# Patient Record
Sex: Female | Born: 1985 | Hispanic: Yes | Marital: Single | Smoking: Never smoker
Health system: Southern US, Community
[De-identification: ages and names within clinical notes are randomized; demographics above are authoritative.]

## PROBLEM LIST (undated history)

## (undated) DIAGNOSIS — N946 Dysmenorrhea, unspecified: Secondary | ICD-10-CM

## (undated) HISTORY — DX: Dysmenorrhea, unspecified: N94.6

## (undated) HISTORY — PX: FOOT SURGERY: SHX648

## (undated) HISTORY — PX: TUBAL LIGATION: SHX77

---
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
## 2006-01-18 ENCOUNTER — Emergency Department: Payer: Self-pay | Admitting: Emergency Medicine

## 2006-10-23 IMAGING — CR RIGHT FOOT COMPLETE - 3+ VIEW
1 series · 3 of 3 positions shown · non-contrast
Comparison: none

REASON FOR EXAM: swelling,pain s/p puncture wound r/o foreign body
COMMENTS:

PROCEDURE:     DXR - DXR FOOT RT COMPLETE W/OBLIQUES  - November 16, 2005  [DATE]
RESULT:     Three views of the RIGHT foot show no fracture, dislocation or
other acute bony abnormality.

[Series 1: view not recorded · 0.17mm/px · 3 of 3 slices shown]
[im 1/3]
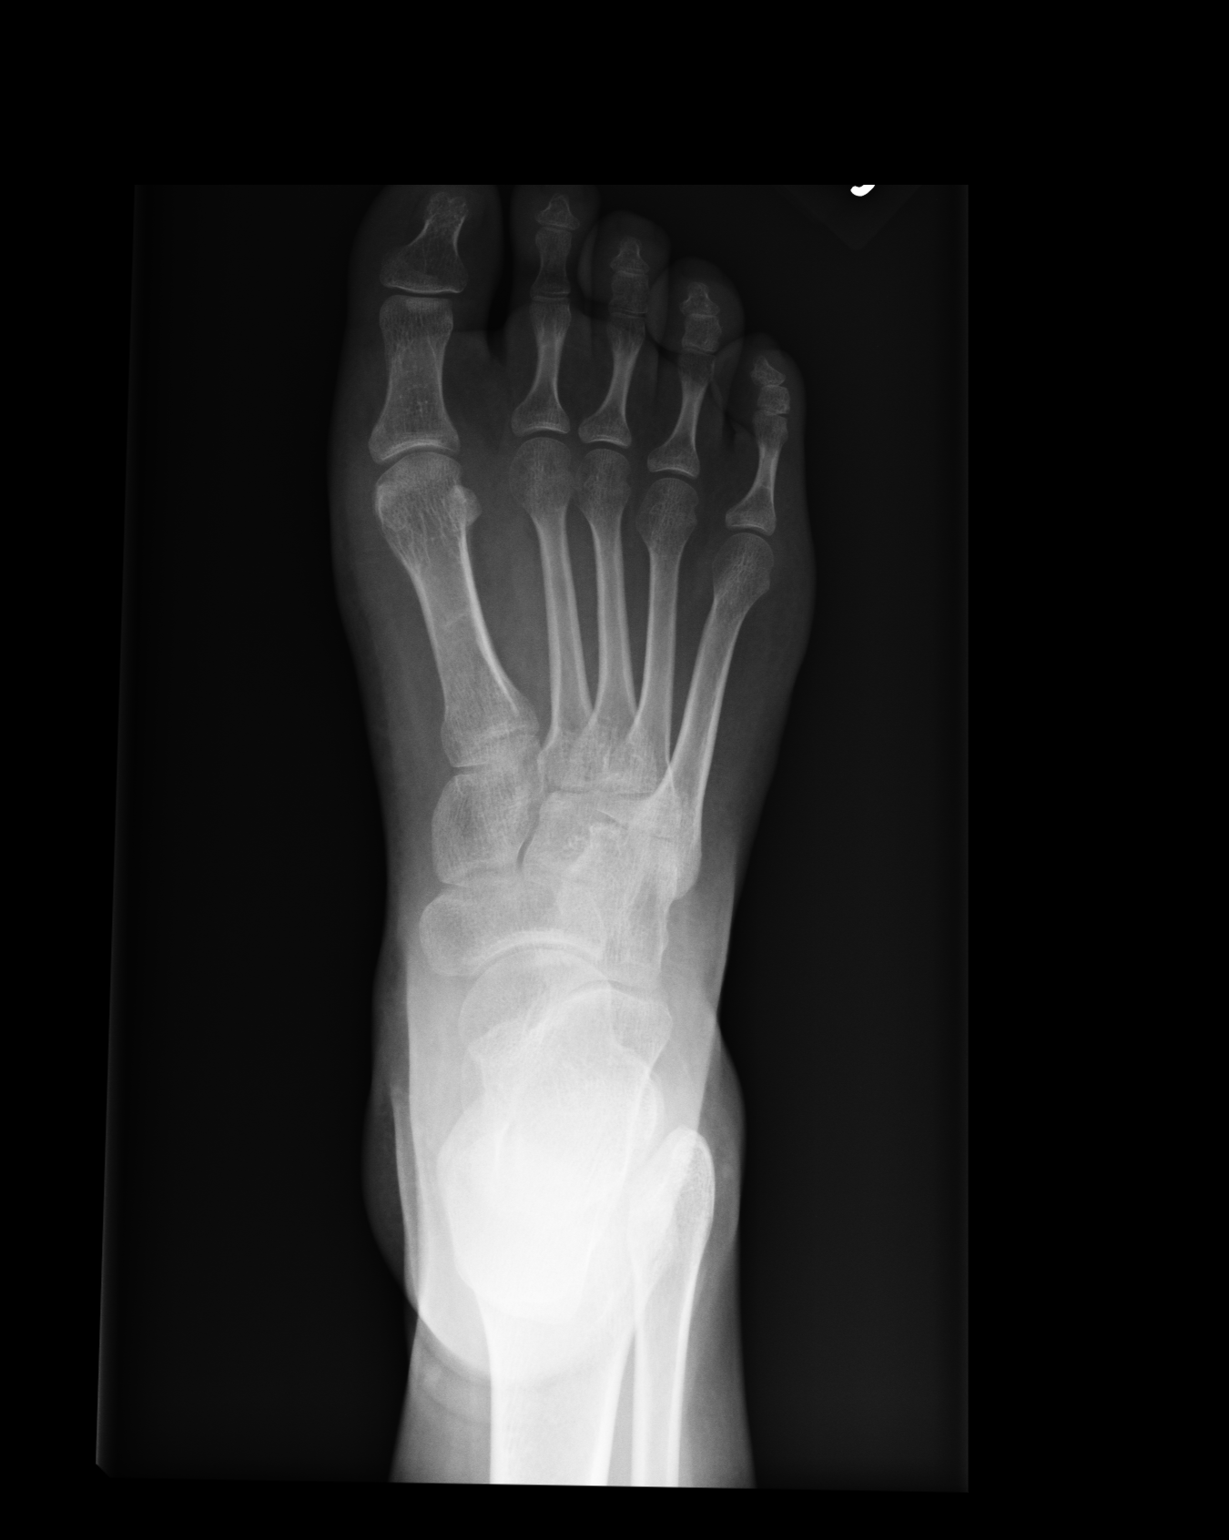
[im 2/3]
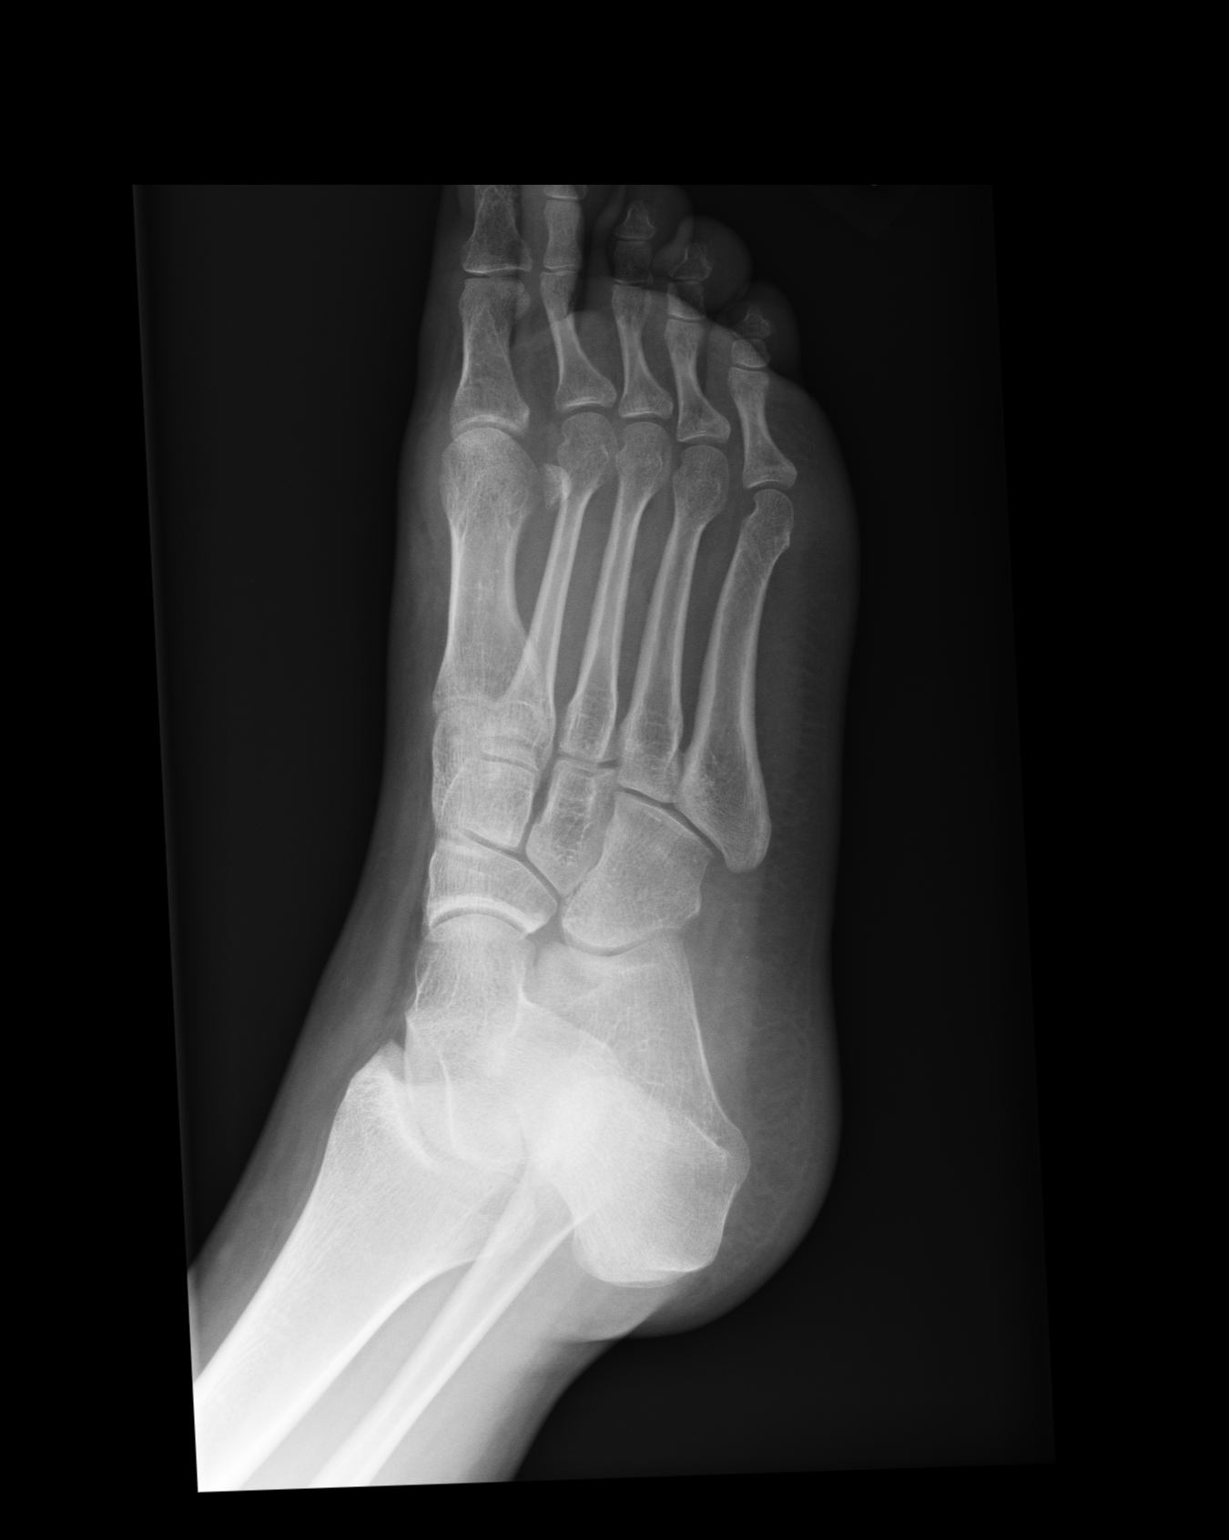
[im 3/3]
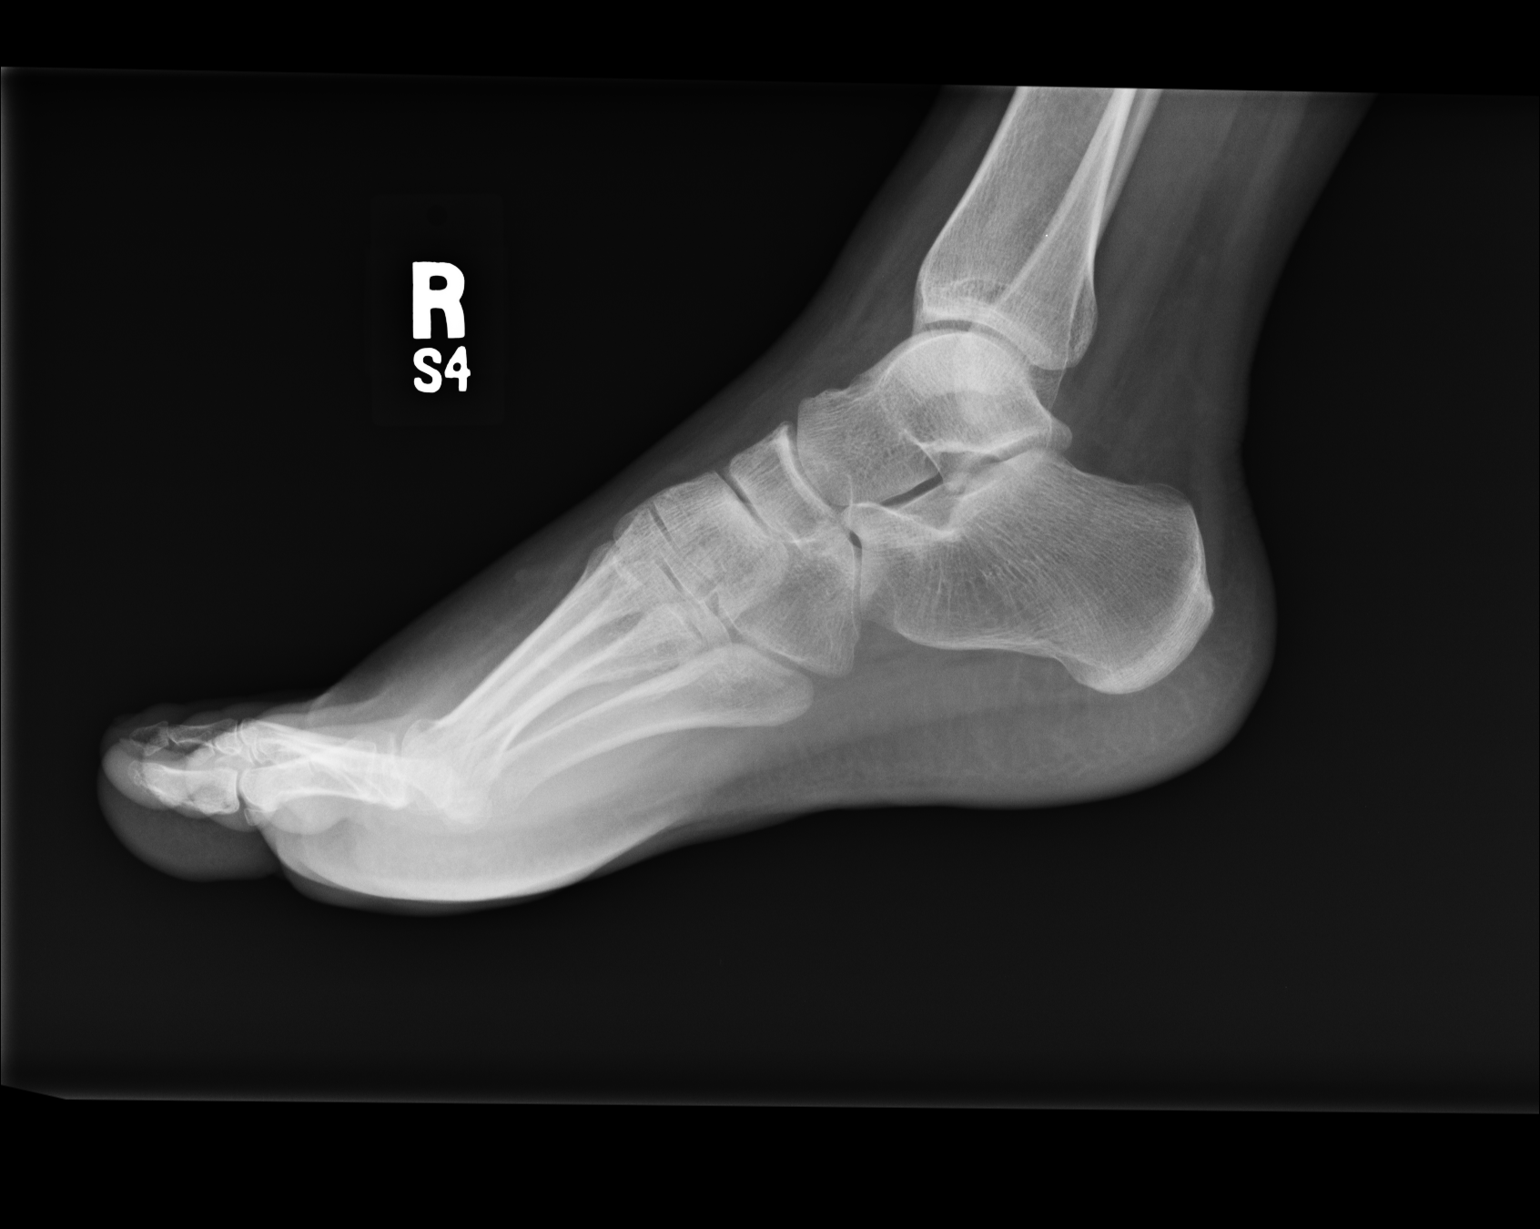

[3 of 3 positions shown; findings below may reference images not displayed]

IMPRESSION: 1)No significant abnormalities are noted.

## 2007-04-20 ENCOUNTER — Emergency Department: Payer: Self-pay | Admitting: Emergency Medicine

## 2008-07-09 ENCOUNTER — Emergency Department: Payer: Self-pay | Admitting: Emergency Medicine

## 2008-08-04 ENCOUNTER — Emergency Department: Payer: Self-pay | Admitting: Emergency Medicine

## 2009-07-05 ENCOUNTER — Ambulatory Visit: Payer: Self-pay | Admitting: Podiatrist

## 2009-10-22 ENCOUNTER — Encounter: Admission: RE | Admit: 2009-10-22 | Discharge: 2009-10-22 | Payer: Self-pay | Admitting: Podiatrist

## 2010-09-19 ENCOUNTER — Ambulatory Visit: Payer: Self-pay | Admitting: Family Medicine

## 2010-11-28 ENCOUNTER — Observation Stay: Payer: Self-pay

## 2011-01-05 ENCOUNTER — Observation Stay: Payer: Self-pay | Admitting: Obstetrics and Gynecology

## 2011-02-25 ENCOUNTER — Inpatient Hospital Stay: Payer: Self-pay | Admitting: Obstetrics and Gynecology

## 2012-01-27 ENCOUNTER — Emergency Department: Payer: Self-pay | Admitting: Emergency Medicine

## 2013-02-09 ENCOUNTER — Observation Stay: Payer: Self-pay | Admitting: Obstetrics and Gynecology

## 2013-02-10 ENCOUNTER — Inpatient Hospital Stay: Payer: Self-pay

## 2013-02-10 LAB — URINALYSIS, COMPLETE
Glucose,UR: NEGATIVE mg/dL (ref 0–75)
Nitrite: NEGATIVE
Ph: 7 (ref 4.5–8.0)
Protein: 30
RBC,UR: 48 /HPF (ref 0–5)
Specific Gravity: 1.02 (ref 1.003–1.030)
WBC UR: 141 /HPF (ref 0–5)

## 2013-02-10 LAB — CBC WITH DIFFERENTIAL/PLATELET
Basophil #: 0 10*3/uL (ref 0.0–0.1)
Basophil %: 0.1 %
Eosinophil #: 0 10*3/uL (ref 0.0–0.7)
Eosinophil %: 0.1 %
HGB: 10.4 g/dL — ABNORMAL LOW (ref 12.0–16.0)
Lymphocyte %: 3.6 %
MCH: 27.5 pg (ref 26.0–34.0)
MCHC: 33.3 g/dL (ref 32.0–36.0)
WBC: 12.6 10*3/uL — ABNORMAL HIGH (ref 3.6–11.0)

## 2013-02-10 LAB — COMPREHENSIVE METABOLIC PANEL
Albumin: 2.5 g/dL — ABNORMAL LOW (ref 3.4–5.0)
Anion Gap: 8 (ref 7–16)
BUN: 5 mg/dL — ABNORMAL LOW (ref 7–18)
Bilirubin,Total: 0.7 mg/dL (ref 0.2–1.0)
Chloride: 107 mmol/L (ref 98–107)
Co2: 21 mmol/L (ref 21–32)
EGFR (African American): >60
EGFR (Non-African Amer.): >60
Potassium: 3.2 mmol/L — ABNORMAL LOW (ref 3.5–5.1)
SGPT (ALT): 14 U/L (ref 12–78)
Total Protein: 6.3 g/dL — ABNORMAL LOW (ref 6.4–8.2)

## 2013-02-12 LAB — URINE CULTURE

## 2013-02-15 LAB — CULTURE, BLOOD (SINGLE)

## 2013-03-29 ENCOUNTER — Inpatient Hospital Stay: Payer: Self-pay | Admitting: Obstetrics and Gynecology

## 2013-03-29 LAB — CBC WITH DIFFERENTIAL/PLATELET
HGB: 10.9 g/dL — ABNORMAL LOW (ref 12.0–16.0)
Lymphocyte #: 2.4 10*3/uL (ref 1.0–3.6)
MCH: 27.4 pg (ref 26.0–34.0)
MCHC: 33.7 g/dL (ref 32.0–36.0)
MCV: 81 fL (ref 80–100)
Monocyte %: 4.9 %
Platelet: 208 10*3/uL (ref 150–440)
RDW: 16 % — ABNORMAL HIGH (ref 11.5–14.5)
WBC: 10.3 10*3/uL (ref 3.6–11.0)

## 2013-10-25 ENCOUNTER — Ambulatory Visit: Payer: Self-pay | Admitting: Obstetrics and Gynecology

## 2013-10-25 LAB — CBC
HCT: 42.2 % (ref 35.0–47.0)
HGB: 14.2 g/dL (ref 12.0–16.0)
MCH: 28.2 pg (ref 26.0–34.0)
MCHC: 33.7 g/dL (ref 32.0–36.0)
MCV: 84 fL (ref 80–100)
Platelet: 252 10*3/uL (ref 150–440)
RBC: 5.05 10*6/uL (ref 3.80–5.20)
RDW: 14.9 % — ABNORMAL HIGH (ref 11.5–14.5)
WBC: 7.1 10*3/uL (ref 3.6–11.0)

## 2013-10-25 LAB — COMPREHENSIVE METABOLIC PANEL
ANION GAP: 2 — AB (ref 7–16)
Albumin: 3.6 g/dL (ref 3.4–5.0)
Alkaline Phosphatase: 87 U/L
BUN: 11 mg/dL (ref 7–18)
Bilirubin,Total: 0.5 mg/dL (ref 0.2–1.0)
Calcium, Total: 8.7 mg/dL (ref 8.5–10.1)
Chloride: 107 mmol/L (ref 98–107)
Co2: 27 mmol/L (ref 21–32)
Creatinine: 0.69 mg/dL (ref 0.60–1.30)
EGFR (Non-African Amer.): >60
Glucose: 108 mg/dL — ABNORMAL HIGH (ref 65–99)
Osmolality: 272 (ref 275–301)
Potassium: 3.8 mmol/L (ref 3.5–5.1)
SGOT(AST): 27 U/L (ref 15–37)
SGPT (ALT): 32 U/L (ref 12–78)
Sodium: 136 mmol/L (ref 136–145)
TOTAL PROTEIN: 7.5 g/dL (ref 6.4–8.2)

## 2013-10-25 LAB — PREGNANCY, URINE: PREGNANCY TEST, URINE: NEGATIVE m[IU]/mL

## 2013-11-02 ENCOUNTER — Ambulatory Visit: Payer: Self-pay | Admitting: Obstetrics and Gynecology

## 2014-09-19 ENCOUNTER — Emergency Department: Payer: Self-pay | Admitting: Emergency Medicine

## 2015-01-12 NOTE — Op Note (Signed)
PATIENT NAME:  Gail Braun, Gail Braun MR#:  [id number] DATE OF BIRTH:  07/07/1986  VISIT NUMBER: [id number]  DATE OF PROCEDURE:  11/02/2013  PREOPERATIVE DIAGNOSIS: Multiparity, desires permanent sterility.  POSTOPERATIVE  DIAGNOSIS: Multiparity, desires permanent sterility.  PROCEDURE: Laparoscopic bilateral tubal ligation using Hulka clips.   ANESTHESIA: General.   SURGEON: Conard Novak, MD  ESTIMATED BLOOD LOSS: Minimal.   OPERATIVE FLUIDS: 900 mL crystalloid.   COMPLICATIONS: None.   FINDINGS: Normal-appearing uterus, fallopian tubes and ovaries.   SPECIMENS: None.   CONDITION: Stable at the end of the procedure.   INDICATION FOR PROCEDURE: The patient is a 29 year old gravida 4, para 4 female who is recently postpartum, who desires permanent sterilization. She has been counseled extensively regarding the risks, benefits and alternatives of permanent sterilization. She has been counseled regarding failure rate of roughly 3 to 5 out of every 1000 procedures performed, with an increased risk of an ectopic pregnancy should pregnancy occur. She has been counseled thoroughly regarding alternatives that have the same efficacy with potentially better side effects and less risk of regret given their reversible nature. Even with all this counseling, she continued to desired permanent sterilization. She was therefore taken to the operating room.   PROCEDURE IN DETAIL: The patient was taken to the operating room, where general anesthesia was administered and found to be adequate. She was placed in dorsal supine lithotomy position in [place] stirrups and prepped and draped in the usual sterile fashion. After a timeout was called, a Foley catheter was placed in the bladder for bladder decompression throughout the procedure. A sponge stick was placed in the vagina for uterine manipulation.   Attention was turned to the abdomen, where a 5 mm infraumbilical incision was made with a scalpel after  injection of local anesthetic. Entry was gained into the abdomen using a trocar with direct visualization of the scope attached to the trocar. Entry was verified using opening pressures. The abdomen was then insufflated with CO2. The camera was reintroduced through the trocar, and verification of atraumatic entry was noted. The pelvic survey was undertaken with the above-noted findings. After local anesthetic was administered, approximately 2 cm in the midline above the pubic symphysis, an 11 mm incision was made, and the 11 mm trocar was placed under direct intra-abdominal camera visualization without difficulty. The right fallopian tube was identified and followed out to the fimbriated end. The Hulka clip applicator was then introduced through the 11 mm port, and at approximately 3 cm from the cornual edge, the fallopian tube was identified, and the clip was placed across the tube such that assurance of complete occlusion of the entire tube was verified. A second clip was applied in the same manner on the right fallopian tube.   The left fallopian tube was similarly identified and followed out to the fimbriated end, and using the same technique, 2 Hulka clips were applied to the left fallopian tube, with assurance that the clip was completely crossing the entire width of the tube.   At this point, the procedure was completed, with hemostasis noted. The abdomen was desufflated of CO2, and the trocars were removed. Each incision site was closed in a subcutaneous fashion using a 4-0 Vicryl, and the skin was reapproximated using Dermabond. Each site was injected with more local anesthetic for a total of 20 mL of 0.5% Marcaine plain.   The patient tolerated the procedure well. Sponge, lap and needle counts were correct x2. The Foley catheter was removed from her bladder  at the end of the procedure, and the sponge stick was also removed from her vagina, and the vagina was inspected to ensure that no remaining  sponges or instrumentation were left in the vagina at the end of the case. For VTE prophylaxis, she was wearing pneumatic compression stockings throughout the entire case. She was awakened in the operating room and taken to the recovery area in stable condition.   ____________________________ Conard NovakStephen D. Khaidyn Staebell, MD sdj:lb D: 11/02/2013 15:06:53 ET T: 11/02/2013 15:30:39 ET JOB#: [id number]  cc: Conard NovakStephen D. Chasyn Cinque, MD, <Dictator> Conard NovakSTEPHEN D Rodderick Holtzer MD ELECTRONICALLY SIGNED 11/19/2013 15:29

## 2015-01-29 NOTE — H&P (Signed)
L&D Evaluation:  History:  HPI 29 yo G4P2103 at 40w5dgestational age by LMP consistent with 20 week ultrasound.  Pregnancy uncomplicated to this point. Presents with fevers, chills, flank pain, trouble breathing, nausea, and emesis since about 1pm on 5/22.  She notes occasional fetal movement.  She was seen several hours ago on L&D for no fetal movement and the patient was febrile, tachycardic to 140s. Fetal heart rate present at 180 with moderate variability. She was sent back to ER where after workup appears she has UTI.  She was sent to ER given respiratory complaints and fever/tachycardia/tachypnea.  She received Rocephin 1 gram at 130am today.  She now notes +fetal movement, no vaginal bleeding, no lof, no contractions. O+ / RI / VZI / HIV neg / RPR NR / GBS unknown   Patient's Medical History No Chronic Illness   Patient's Surgical History cyst removed from foot in 2011   Medications Pre Natal Vitamins   Allergies NKDA   Social History none   Family History Non-Contributory   ROS:  ROS All systems were reviewed.  HEENT, CNS, GI, GU, Respiratory, CV, Renal and Musculoskeletal systems were found to be normal., unless noted in HPI   Exam:  Vital Signs Temp 36.8C (98.55F), P 98, BP 94/55    General no apparent distress   Mental Status clear    Chest clear    Heart normal sinus rhythm   Abdomen gravid, non-tender   Estimated Fetal Weight Average for gestational age   Back CVAT, R>L   Edema no edema    FHT normal rate with no decels   FHT Description 135/mod var/+accels/no decels   Ucx absent   Skin no lesions   Impression:  Impression 1) Intrauterine pregnancy at 395w5destational age, 2) Acute pyelonephritis   Plan:  Comments 1) admit for IV antibiotic administration, Ceftriaxone 1 gram q 24 hours 2) Fetal well being: Reassuring, continue PNVs 3) hypokalemia: given potassium repletion in ER.   Labs:  Lab Results:  Hepatic:  23-May-14 00:10    Bilirubin, Total 0.7  Alkaline Phosphatase 132  SGPT (ALT) 14  SGOT (AST) 22  Total Protein, Serum  6.3  Albumin, Serum  2.5  Routine Chem:  23-May-14 00:10   Glucose, Serum 87  BUN  5  Creatinine (comp) 0.62  Sodium, Serum 136  Potassium, Serum  3.2  Chloride, Serum 107  CO2, Serum 21  Calcium (Total), Serum  8.2  Osmolality (calc) 269  eGFR (African American) >60  eGFR (Non-African American) >60 (eGFR values <6066min/1.73 m2 may be an indication of chronic kidney disease (CKD). Calculated eGFR is useful in patients with stable renal function. The eGFR calculation will not be reliable in acutely ill patients when serum creatinine is changing rapidly. It is not useful in  patients on dialysis. The eGFR calculation may not be applicable to patients at the low and high extremes of body sizes, pregnant women, and vegetarians.)  Anion Gap 8  Routine UA:  23-May-14 00:10   Color (UA) Yellow  Clarity (UA) Hazy  Glucose (UA) Negative  Bilirubin (UA) Negative  Ketones (UA) Trace  Specific Gravity (UA) 1.020  Blood (UA) 1+  pH (UA) 7.0  Protein (UA) 30 mg/dL  Nitrite (UA) Negative  Leukocyte Esterase (UA) 3+ (Result(s) reported on 10 Feb 2013 at 12:48AM.)  RBC (UA) 48 /HPF  WBC (UA) 141 /HPF  Bacteria (UA) 3+  Epithelial Cells (UA) 2 /HPF  Mucous (UA) PRESENT (Result(s) reported on 10 Feb 2013 at 12:48AM.)  Routine Hem:  23-May-14 00:10   WBC (CBC)  12.6  RBC (CBC)  3.79  Hemoglobin (CBC)  10.4  Hematocrit (CBC)  31.3  Platelet Count (CBC) 226  MCV 83  MCH 27.5  MCHC 33.3  RDW 14.2  Neutrophil % 95.6  Lymphocyte % 3.6  Monocyte % 0.6  Eosinophil % 0.1  Basophil % 0.1  Neutrophil #  12.1  Lymphocyte #  0.5  Monocyte #  0.1  Eosinophil # 0.0  Basophil # 0.0 (Result(s) reported on 10 Feb 2013 at 12:50AM.)   Electronic Signatures: Will Bonnet (MD)  (Signed 23-May-14 02:11)  Authored: L&D Evaluation, Labs   Last Updated: 23-May-14 02:11 by Will Bonnet (MD)

## 2015-01-29 NOTE — H&P (Signed)
L&D Evaluation:  History:  HPI 29 year old G4 P2103 with EDC=04/02/2013 by a 20 wk 3 day ultrasound presents for an elective IOL at 2239 3/7 weeks as her husband will be going out of town for a job in 4 days. She has been having irregular contractions and her cx is 4 cm dilated. [facility] for late onset to care, a hospitalization for pyelo at 32 weeks for which continues on Macrobid daily for prophyllaxis, and a normal anatomy scan. OB HX significant for a PTD at 35 weeks in 2003, an IOL in 2006 for oligo at 41 weeks and an IOL at 42 weeks in 2012 for postdates. Infant weights ranged from 4#7oz to 8#12oz. LABS: O pos, RI, VI, GBS negative. PAtient desires ppBTL and has has been counseled on pros, cons and risks. 30 day papers signed 01/11/2013   Presents with for elective IOL   Patient's Medical History Pyelo   Patient's Surgical History foot surgery   Medications Pre Natal Vitamins  MacroBID daily   Allergies NKDA   Social History none   Family History Non-Contributory   ROS:  ROS see HPI   Exam:  Vital Signs stable   Urine Protein not completed   General no apparent distress   Mental Status clear   Chest clear   Heart normal sinus rhythm, no murmur/gallop/rubs   Abdomen gravid, non-tender   Estimated Fetal Weight Average for gestational age   Fetal Position vtx   Edema no edema   Pelvic no external lesions, 4/40%/-2   Mebranes Intact   FHT normal rate with no decels   FHT Description mod variability   Ucx occasional   Skin dry   Impression:  Impression IUP at 39 3/7 weeks for IOL   Plan:  Plan EFM/NST, monitor contractions and for cervical change, Plan Pitocin IOL-explained risks of hyperstimulation, FITL, C-section.. Patient  desires to proceed with IOL.   Electronic Signatures: Trinna BalloonGutierrez, Dama Hedgepeth L (CNM)  (Signed 09-Jul-14 11:17)  Authored: L&D Evaluation   Last Updated: 09-Jul-14 11:17 by Trinna BalloonGutierrez, Joeph Szatkowski L (CNM)

## 2015-02-09 ENCOUNTER — Emergency Department
Admission: EM | Admit: 2015-02-09 | Discharge: 2015-02-09 | Disposition: A | Discharge status: Home / Self Care | Payer: BLUE CROSS/BLUE SHIELD | Attending: Emergency Medicine | Admitting: Emergency Medicine

## 2015-02-09 ENCOUNTER — Encounter: Payer: Self-pay | Admitting: *Deleted

## 2015-02-09 DIAGNOSIS — R51 Headache: Secondary | ICD-10-CM | POA: Diagnosis not present

## 2015-02-09 DIAGNOSIS — K029 Dental caries, unspecified: Secondary | ICD-10-CM | POA: Diagnosis not present

## 2015-02-09 DIAGNOSIS — K047 Periapical abscess without sinus: Secondary | ICD-10-CM | POA: Diagnosis not present

## 2015-02-09 DIAGNOSIS — K088 Other specified disorders of teeth and supporting structures: Secondary | ICD-10-CM | POA: Diagnosis present

## 2015-02-09 MED ORDER — TRAMADOL HCL 50 MG PO TABS
ORAL_TABLET | ORAL | Status: DC
Start: 2015-02-09 — End: 2015-02-09
  Filled 2015-02-09: qty 1

## 2015-02-09 MED ORDER — TRAMADOL HCL 50 MG PO TABS
50.0000 mg | ORAL_TABLET | Freq: Once | ORAL | Status: AC
  Administered 2015-02-09: 50 mg via ORAL

## 2015-02-09 MED ORDER — TRAMADOL HCL 50 MG PO TABS: 50.0000 mg | ORAL_TABLET | Freq: Four times a day (QID) | ORAL | Status: AC | PRN

## 2015-02-09 MED ORDER — AMOXICILLIN 500 MG PO CAPS: 500.0000 mg | ORAL_CAPSULE | Freq: Three times a day (TID) | ORAL | Status: AC

## 2015-02-09 NOTE — ED Provider Notes (Signed)
CSN: [id number]     Arrival date & time 02/09/15  4098 History   First MD Initiated Contact with Patient 02/09/15 [phone]     Chief Complaint  Patient presents with  . Dental Pain     (Consider location/radiation/quality/duration/timing/severity/associated sxs/prior Treatment) HPI Comments: Pt c/o right lower dental pain since yesterday to 1st molar. Pt has known cavity to tooth. Taking motrin without relief. Sees a dentist, Dr. [place], but was unable to get in touch with him yesterday.   Patient is a 29 y.o. female presenting with tooth pain. The history is provided by the patient.  Dental Pain Location:  Lower Lower teeth location:  30/RL 1st molar Quality:  Dull Severity:  Severe Onset quality:  Sudden Duration:  2 days Timing:  Constant Progression:  Unchanged Chronicity:  New Context: dental caries and poor dentition   Prior workup: none. Relieved by:  Nothing Worsened by:  Hot food/drink, jaw movement, pressure, touching and cold food/drink Ineffective treatments:  NSAIDs Associated symptoms: facial pain and gum swelling   Associated symptoms: no facial swelling, no fever, no headaches, no oral bleeding, no oral lesions and no trismus   Risk factors: sufficient dental care     History reviewed. No pertinent past medical history. Past Surgical History  Procedure Laterality Date  . Foot surgery Right    History reviewed. No pertinent family history. History  Substance Use Topics  . Smoking status: Never Smoker   . Smokeless tobacco: Never Used  . Alcohol Use: No   OB History    No data available     Review of Systems  Constitutional: Negative for fever.  HENT: Positive for dental problem. Negative for facial swelling and mouth sores.   Neurological: Negative for headaches.  All other systems reviewed and are negative.     Allergies  Review of patient's allergies indicates no known allergies.  Home Medications   Prior to Admission medications     Medication Sig Start Date End Date Taking? Authorizing Provider  amoxicillin (AMOXIL) 500 MG capsule Take 1 capsule (500 mg total) by mouth 3 (three) times daily. 02/09/15 02/19/15  Luvenia Redden, PA-C  traMADol (ULTRAM) 50 MG tablet Take 1 tablet (50 mg total) by mouth every 6 (six) hours as needed. 02/09/15 02/09/16  Wilber Oliphant V, PA-C   BP 128/81 mmHg  Pulse 64  Temp(Src) 98.2 F (36.8 C) (Oral)  Resp 18  Ht  (1.575 m)  Wt 198 lb (89.812 kg)  BMI 36.21 kg/m2  SpO2 99%  LMP 02/09/2015 (Exact Date) Physical Exam  Constitutional: She appears well-developed and well-nourished.  HENT:  Head: Normocephalic and atraumatic.  Right Ear: Tympanic membrane and external ear normal.  Left Ear: Tympanic membrane and external ear normal.  Nose: Nose normal.  Mouth/Throat: Uvula is midline, oropharynx is clear and moist and mucous membranes are normal. Abnormal dentition. Dental abscesses and dental caries present.    Eyes: Conjunctivae are normal.  Neck: Trachea normal, normal range of motion and phonation normal. Neck supple.  Psychiatric: She has a normal mood and affect. Her speech is normal.  Nursing note and vitals reviewed.   ED Course  Procedures (including critical care time) Labs Review Labs Reviewed - No data to display  Imaging Review No results found.   EKG Interpretation None      MDM  Course of Amoxicillin for infection Ultram as needed for pain,  PO given in ER Advised to follow up with Dentist on Monday, return for  worsening symptoms.  Final diagnoses:  Dental abscess       Luvenia Reddenmma Weavil V, PA-C 02/09/15 [id number]  Governor Rooksebecca Lord, MD 02/11/15 1515

## 2015-02-09 NOTE — Discharge Instructions (Signed)

## 2015-02-09 NOTE — ED Notes (Addendum)
Pt presents w/ c/o R lower dental pain, worsening yesterday. Pt has 3 mon hx of having dental problems on that side. Ibuprofen 800 mg last taken x 1 hr ago w/o relief. Pt is going to obtain a ride home.

## 2015-11-14 ENCOUNTER — Emergency Department
Admission: EM | Admit: 2015-11-14 | Discharge: 2015-11-14 | Disposition: A | Discharge status: Home / Self Care | Payer: 59 | Attending: Emergency Medicine | Admitting: Emergency Medicine

## 2015-11-14 ENCOUNTER — Encounter: Payer: Self-pay | Admitting: Emergency Medicine

## 2015-11-14 DIAGNOSIS — Z3202 Encounter for pregnancy test, result negative: Secondary | ICD-10-CM | POA: Insufficient documentation

## 2015-11-14 DIAGNOSIS — N3000 Acute cystitis without hematuria: Secondary | ICD-10-CM

## 2015-11-14 DIAGNOSIS — J020 Streptococcal pharyngitis: Secondary | ICD-10-CM | POA: Diagnosis not present

## 2015-11-14 DIAGNOSIS — R509 Fever, unspecified: Secondary | ICD-10-CM | POA: Diagnosis present

## 2015-11-14 LAB — POCT PREGNANCY, URINE: Preg Test, Ur: NEGATIVE

## 2015-11-14 LAB — URINALYSIS COMPLETE WITH MICROSCOPIC (ARMC ONLY)
BILIRUBIN URINE: NEGATIVE
GLUCOSE, UA: NEGATIVE mg/dL
Hgb urine dipstick: NEGATIVE
KETONES UR: NEGATIVE mg/dL
Nitrite: NEGATIVE
Protein, ur: NEGATIVE mg/dL
Specific Gravity, Urine: 1.018 (ref 1.005–1.030)
pH: 8 (ref 5.0–8.0)

## 2015-11-14 LAB — POCT RAPID STREP A: STREPTOCOCCUS, GROUP A SCREEN (DIRECT): POSITIVE — AB

## 2015-11-14 LAB — RAPID INFLUENZA A&B ANTIGENS (ARMC ONLY)
INFLUENZA A (ARMC): NOT DETECTED
INFLUENZA B (ARMC): NOT DETECTED

## 2015-11-14 MED ORDER — LIDOCAINE VISCOUS 2 % MT SOLN: 20.0000 mL | OROMUCOSAL | Status: DC | PRN

## 2015-11-14 MED ORDER — CEPHALEXIN 500 MG PO CAPS: 500.0000 mg | ORAL_CAPSULE | Freq: Four times a day (QID) | ORAL | Status: AC

## 2015-11-14 NOTE — Discharge Instructions (Signed)

## 2015-11-14 NOTE — ED Provider Notes (Signed)
[facility] Emergency Department Provider Note  ____________________________________________  Time seen: Approximately 4:16 PM  I have reviewed the triage vital signs and the nursing notes.   HISTORY  Chief Complaint Generalized Body Aches and Fever    HPI Gail Braun is a 30 y.o. female resents with complaints of fever, chills, body aches and sore throat 2 days. In addition patient states that she is noted urinary burning 1 day. Onset of both was sudden. Described as moderate symptoms around 5 or 6/10. Denies any vaginal discharge. No recent surgeries new meds no change in activity. Nothing seems to make the symptoms better or worse. Has tried over-the-counter medications without relief. She reports having 4 children at home all diagnosed with strep throat.  History reviewed. No pertinent past medical history.  There are no active problems to display for this patient.   Past Surgical History  Procedure Laterality Date  . Foot surgery Right   . Tubal ligation      Current Outpatient Rx  Name  Route  Sig  Dispense  Refill  . cephALEXin (KEFLEX) 500 MG capsule   Oral   Take 1 capsule (500 mg total) by mouth 4 (four) times daily.   40 capsule   0   . lidocaine (XYLOCAINE) 2 % solution   Mouth/Throat   Use as directed 20 mLs in the mouth or throat as needed for mouth pain.   100 mL   0     Allergies Review of patient's allergies indicates no known allergies.  No family history on file.  Social History Social History  Substance Use Topics  . Smoking status: Never Smoker   . Smokeless tobacco: None  . Alcohol Use: No    Review of Systems Constitutional: Positive for fever/chills Eyes: No visual changes. ENT: Positive sore throat. Cardiovascular: Denies chest pain. Respiratory: Denies shortness of breath. Gastrointestinal: No abdominal pain.  No nausea, no vomiting.  No diarrhea.  No constipation. Genitourinary: Positive for  dysuria Musculoskeletal: Negative for back pain. Skin: Negative for rash. Neurological: Negative for headaches, focal weakness or numbness.  10-point ROS otherwise negative.  ____________________________________________   PHYSICAL EXAM:  VITAL SIGNS: ED Triage Vitals  Enc Vitals Group     BP 11/14/15 1555 120/68 mmHg     Pulse Rate 11/14/15 1555 69     Resp 11/14/15 1555 16     Temp 11/14/15 1555 98.3 F (36.8 C)     Temp Source 11/14/15 1555 Oral     SpO2 11/14/15 1555 99 %     Weight 11/14/15 1555 184 lb (83.462 kg)     Height 11/14/15 1555  (1.575 m)     Head Cir --      Peak Flow --      Pain Score 11/14/15 1555 7     Pain Loc --      Pain Edu? --      Excl. in GC? --     Constitutional: Alert and oriented. Well appearing and in no acute distress. Eyes: Conjunctivae are normal. PERRL. EOMI. Head: Atraumatic. Nose: No congestion/rhinnorhea. Mouth/Throat: Mucous membranes are moist.  Oropharynx mildly erythematous Neck: No stridor.  No adenopathy full range of motion. Cardiovascular: Normal rate, regular rhythm. Grossly normal heart sounds.  Good peripheral circulation. Respiratory: Normal respiratory effort.  No retractions. Lungs CTAB. Gastrointestinal: Soft and nontender. No distention. No CVA tenderness. Musculoskeletal: No lower extremity tenderness nor edema.  No joint effusions. Neurologic:  Normal speech and language. No  gross focal neurologic deficits are appreciated. No gait instability. Skin:  Skin is warm, dry and intact. No rash noted. Psychiatric: Mood and affect are normal. Speech and behavior are normal.  ____________________________________________   LABS (all labs ordered are listed, but only abnormal results are displayed)  Labs Reviewed  URINALYSIS COMPLETEWITH MICROSCOPIC ([facility] ONLY) - Abnormal; Notable for the following:    Color, Urine YELLOW (*)    APPearance TURBID (*)    Leukocytes, UA TRACE (*)    Bacteria, UA RARE (*)     Squamous Epithelial / LPF 0-5 (*)    All other components within normal limits  POCT RAPID STREP A - Abnormal; Notable for the following:    Streptococcus, Group A Screen (Direct) POSITIVE (*)    All other components within normal limits  RAPID INFLUENZA A&B ANTIGENS ([facility] ONLY)  POC URINE PREG, ED  POCT PREGNANCY, URINE   ____________________________________________     PROCEDURES  Procedure(s) performed: None  Critical Care performed: No  ____________________________________________   INITIAL IMPRESSION / ASSESSMENT AND PLAN / ED COURSE  Pertinent labs & imaging results that were available during my care of the patient were reviewed by me and considered in my medical decision making (see chart for details).  Rapid strep positive. Diagnosis is acute strep pharyngitis. Urinary tract infection. Rx given for Keflex 500 mg 4 times a day 10 days. Patient given a work excuse 48 hours and her follow-up with her PCP or return to the ER with any worsening symptomology. Patient voices no other emergency medical complaints at this time. ____________________________________________   FINAL CLINICAL IMPRESSION(S) / ED DIAGNOSES  Final diagnoses:  Streptococcal pharyngitis  Acute cystitis without hematuria     This chart was dictated using voice recognition software/Dragon. Despite best efforts to proofread, errors can occur which can change the meaning. Any change was purely unintentional.   Evangeline Dakin, PA-C 11/14/15 1802  Phineas Semen, MD 11/14/15 1950

## 2015-11-14 NOTE — ED Notes (Signed)
Pt reports sore throat, fever and generalized body aches since Tuesday. Denies any other symptoms at present. Pt alert and oriented upon triage, no apparent distress noted.

## 2015-11-14 NOTE — ED Notes (Signed)
Fever body aches and sore throat since Tuesday . Afebrile at present   No diff swallowing noted

## 2015-11-15 ENCOUNTER — Encounter: Payer: Self-pay | Admitting: Emergency Medicine

## 2016-07-20 ENCOUNTER — Emergency Department: Payer: BLUE CROSS/BLUE SHIELD

## 2016-07-20 ENCOUNTER — Encounter: Payer: Self-pay | Admitting: Emergency Medicine

## 2016-07-20 ENCOUNTER — Emergency Department
Admission: EM | Admit: 2016-07-20 | Discharge: 2016-07-20 | Disposition: A | Discharge status: Home / Self Care | Payer: BLUE CROSS/BLUE SHIELD | Attending: Emergency Medicine | Admitting: Emergency Medicine

## 2016-07-20 DIAGNOSIS — M70832 Other soft tissue disorders related to use, overuse and pressure, left forearm: Secondary | ICD-10-CM

## 2016-07-20 DIAGNOSIS — M25532 Pain in left wrist: Secondary | ICD-10-CM

## 2016-07-20 DIAGNOSIS — Y9389 Activity, other specified: Secondary | ICD-10-CM | POA: Insufficient documentation

## 2016-07-20 DIAGNOSIS — M25832 Other specified joint disorders, left wrist: Secondary | ICD-10-CM

## 2016-07-20 DIAGNOSIS — M70932 Unspecified soft tissue disorder related to use, overuse and pressure, left forearm: Secondary | ICD-10-CM | POA: Diagnosis not present

## 2016-07-20 MED ORDER — NAPROXEN 500 MG PO TABS: 500.0000 mg | ORAL_TABLET | Freq: Two times a day (BID) | ORAL | 0 refills | Status: DC

## 2016-07-20 MED ORDER — NAPROXEN 500 MG PO TABS
500.0000 mg | ORAL_TABLET | Freq: Once | ORAL | Status: AC
  Administered 2016-07-20: 500 mg via ORAL
  Filled 2016-07-20: qty 1

## 2016-07-20 NOTE — ED Provider Notes (Signed)
[facility] Emergency Department Provider Note   ____________________________________________   First MD Initiated Contact with Patient 07/20/16 1519     (approximate)  I have reviewed the triage vital signs and the nursing notes.   HISTORY  Chief Complaint Wrist Pain    HPI Gail Braun is a 30 y.o. female patient complaining of left wrist pain for 2 weeks. Paced the pain is increased in the last 2 days. Patient stated wrist is more sore in the morning and gets better with use but the pain never completely resolves. Patient rates the pain as 8/10. She described a pain as "achy". Except for over-the-counter anti-inflammatory medication and no other ecchymosis measures for this complaint. Patient is right-hand dominant.   History reviewed. No pertinent past medical history.  There are no active problems to display for this patient.   Past Surgical History:  Procedure Laterality Date  . FOOT SURGERY Right   . TUBAL LIGATION      Prior to Admission medications   Medication Sig Start Date End Date Taking? Authorizing Provider  lidocaine (XYLOCAINE) 2 % solution Use as directed 20 mLs in the mouth or throat as needed for mouth pain. 11/14/15   Evangeline Dakinharles M Beers, PA-C  naproxen (NAPROSYN) 500 MG tablet Take 1 tablet (500 mg total) by mouth 2 (two) times daily with a meal. 07/20/16   Joni Reiningonald K Smith, PA-C    Allergies Review of patient's allergies indicates no known allergies.  No family history on file.  Social History Social History  Substance Use Topics  . Smoking status: Never Smoker  . Smokeless tobacco: Never Used  . Alcohol use No    Review of Systems Constitutional: No fever/chills Eyes: No visual changes. ENT: No sore throat. Cardiovascular: Denies chest pain. Respiratory: Denies shortness of breath. Gastrointestinal: No abdominal pain.  No nausea, no vomiting.  No diarrhea.  No constipation. Genitourinary: Negative for  dysuria. Musculoskeletal: Left wrist pain  Skin: Negative for rash. Neurological: Negative for headaches, focal weakness or numbness.    ____________________________________________   PHYSICAL EXAM:  VITAL SIGNS: ED Triage Vitals  Enc Vitals Group     BP 07/20/16 1508 125/67     Pulse Rate 07/20/16 1508 64     Resp 07/20/16 1508 18     Temp 07/20/16 1508 97.8 F (36.6 C)     Temp Source 07/20/16 1508 Oral     SpO2 07/20/16 1508 99 %     Weight 07/20/16 1508 188 lb (85.3 kg)     Height 07/20/16 1508 5\' 2"  (1.575 m)     Head Circumference --      Peak Flow --      Pain Score 07/20/16 1512 8     Pain Loc --      Pain Edu? --      Excl. in GC? --     Constitutional: Alert and oriented. Well appearing and in no acute distress. Eyes: Conjunctivae are normal. PERRL. EOMI. Head: Atraumatic. Nose: No congestion/rhinnorhea. Mouth/Throat: Mucous membranes are moist.  Oropharynx non-erythematous. Neck: No stridor.  No cervical spine tenderness to palpation. Hematological/Lymphatic/Immunilogical: No cervical lymphadenopathy. Cardiovascular: Normal rate, regular rhythm. Grossly normal heart sounds.  Good peripheral circulation. Respiratory: Normal respiratory effort.  No retractions. Lungs CTAB. Gastrointestinal: Soft and nontender. No distention. No abdominal bruits. No CVA tenderness. Musculoskeletal: No obvious deformity to the left wrist. No obvious edema or erythema. Patient has full nuchal range of motion's. Her grip strength is 4/5. Patient  has negative Tinel or Phalen sign.  Neurologic:  Normal speech and language. No gross focal neurologic deficits are appreciated. No gait instability. Skin:  Skin is warm, dry and intact. No rash noted. Psychiatric: Mood and affect are normal. Speech and behavior are normal.  ____________________________________________   LABS (all labs ordered are listed, but only abnormal results are displayed)  Labs Reviewed - No data to  display ____________________________________________  EKG   ____________________________________________  RADIOLOGY  No acute findings x-ray of the left wrist. ____________________________________________   PROCEDURES  Procedure(s) performed: None  Procedures  Critical Care performed: No  ____________________________________________   INITIAL IMPRESSION / ASSESSMENT AND PLAN / ED COURSE  Pertinent labs & imaging results that were available during my care of the patient were reviewed by me and considered in my medical decision making (see chart for details).  Wrist pain secondary to repetitive motion. Patient given discharge Instructions. Patient given a prescription for naproxen. Patient given a work note.  Clinical Course   Patient placed in a Velcro wrist splint. Patient advised to try to use this at work and while she is awake. Patient advised not to sleep with splint. ____________________________________________   FINAL CLINICAL IMPRESSION(S) / ED DIAGNOSES  Final diagnoses:  Left wrist pain  Repetitive motion disease of wrist, left      NEW MEDICATIONS STARTED DURING THIS VISIT:  New Prescriptions   NAPROXEN (NAPROSYN) 500 MG TABLET    Take 1 tablet (500 mg total) by mouth 2 (two) times daily with a meal.     Note:  This document was prepared using Dragon voice recognition software and may include unintentional dictation errors.    Joni Reiningonald K Smith, PA-C 07/20/16 1613    Emily FilbertJonathan E Williams, MD 07/21/16 [phone]

## 2016-07-20 NOTE — Discharge Instructions (Signed)
Wear splint for 5-7 days while awake and at work.

## 2016-07-20 NOTE — ED Triage Notes (Signed)
Presents with left wrist pain for about 2 weeks   Denies any injury   No swelling  States wrist is more sore in the am ..

## 2018-04-30 ENCOUNTER — Encounter: Payer: Self-pay | Admitting: Emergency Medicine

## 2018-04-30 ENCOUNTER — Emergency Department
Admission: EM | Admit: 2018-04-30 | Discharge: 2018-04-30 | Disposition: A | Discharge status: Home / Self Care | Payer: BLUE CROSS/BLUE SHIELD | Attending: Emergency Medicine | Admitting: Emergency Medicine

## 2018-04-30 ENCOUNTER — Other Ambulatory Visit: Payer: Self-pay

## 2018-04-30 DIAGNOSIS — R21 Rash and other nonspecific skin eruption: Secondary | ICD-10-CM | POA: Diagnosis present

## 2018-04-30 DIAGNOSIS — L089 Local infection of the skin and subcutaneous tissue, unspecified: Secondary | ICD-10-CM

## 2018-04-30 MED ORDER — CLINDAMYCIN HCL 300 MG PO CAPS: 300.0000 mg | ORAL_CAPSULE | Freq: Three times a day (TID) | ORAL | 0 refills | Status: AC

## 2018-04-30 NOTE — ED Notes (Signed)
See triage note  States she noticed a "pimple" above lip yesterday  Now area is more painful and swollen

## 2018-04-30 NOTE — Discharge Instructions (Signed)
Please use warm compresses to upper lip. Begin antibiotics. Return to the emergency department for fevers, worsening swelling or pain, or any other concerning symptoms.

## 2018-04-30 NOTE — ED Triage Notes (Signed)
Noted small red sore upper lip. States noticed yesterday.

## 2018-04-30 NOTE — ED Provider Notes (Signed)
[facility] Emergency Department Provider Note  ____________________________________________  Time seen: Approximately 11:05 AM  I have reviewed the triage vital signs and the nursing notes.   HISTORY  Chief Complaint Facial Pain    HPI Gail Braun is a 32 y.o. female that presents emergency department for evaluation of red sore above lip since yesterday.  Patient states that she had a pimple above her lip yesterday.  Today it is more swollen, red, painful.  No drainage.  No fever, chills.  History reviewed. No pertinent past medical history.  There are no active problems to display for this patient.   Past Surgical History:  Procedure Laterality Date  . FOOT SURGERY Right   . TUBAL LIGATION      Prior to Admission medications   Medication Sig Start Date End Date Taking? Authorizing Provider  clindamycin (CLEOCIN) 300 MG capsule Take 1 capsule (300 mg total) by mouth 3 (three) times daily for 10 days. 04/30/18 05/10/18  Enid Derry, PA-C  lidocaine (XYLOCAINE) 2 % solution Use as directed 20 mLs in the mouth or throat as needed for mouth pain. 11/14/15   Beers, Charmayne Sheer, PA-C  naproxen (NAPROSYN) 500 MG tablet Take 1 tablet (500 mg total) by mouth 2 (two) times daily with a meal. 07/20/16   Joni Reining, PA-C    Allergies Patient has no known allergies.  No family history on file.  Social History Social History   Tobacco Use  . Smoking status: Never Smoker  . Smokeless tobacco: Never Used  Substance Use Topics  . Alcohol use: No  . Drug use: No     Review of Systems  Constitutional: No fever/chills ENT: No upper respiratory complaints. Gastrointestinal:  No nausea, no vomiting.  Musculoskeletal: Negative for musculoskeletal pain. Skin: Negative for abrasions, lacerations, ecchymosis.  Positive for rash. Neurological: Negative for headaches.   ____________________________________________   PHYSICAL EXAM:  VITAL  SIGNS: ED Triage Vitals [04/30/18 1042]  Enc Vitals Group     BP (!) 126/59     Pulse Rate 72     Resp 20     Temp 98.6 F (37 C)     Temp Source Oral     SpO2 99 %     Weight 197 lb (89.4 kg)     Height 5\' 2"  (1.575 m)     Head Circumference      Peak Flow      Pain Score 8     Pain Loc      Pain Edu?      Excl. in GC?      Constitutional: Alert and oriented. Well appearing and in no acute distress. Eyes: Conjunctivae are normal. PERRL. EOMI. Head: Atraumatic. ENT:      Ears:      Nose: No congestion/rhinnorhea.      Mouth/Throat: Mucous membranes are moist.  Neck: No stridor.  Cardiovascular: Normal rate, regular rhythm.  Good peripheral circulation. Respiratory: Normal respiratory effort without tachypnea or retractions. Lungs CTAB. Good air entry to the bases with no decreased or absent breath sounds. Musculoskeletal: Full range of motion to all extremities. No gross deformities appreciated. Neurologic:  Normal speech and language. No gross focal neurologic deficits are appreciated.  Skin:  Skin is warm, dry and intact.  Pimple with crusting to upper lip with 1/2 cm surrounding erythema.  No fluctuance. Psychiatric: Mood and affect are normal. Speech and behavior are normal. Patient exhibits appropriate insight and judgement.   ____________________________________________  LABS (all labs ordered are listed, but only abnormal results are displayed)  Labs Reviewed - No data to display ____________________________________________  EKG   ____________________________________________  RADIOLOGY   No results found.  ____________________________________________    PROCEDURES  Procedure(s) performed:    Procedures    Medications - No data to display   ____________________________________________   INITIAL IMPRESSION / ASSESSMENT AND PLAN / ED COURSE  Pertinent labs & imaging results that were available during my care of the patient were reviewed  by me and considered in my medical decision making (see chart for details).  Review of the [place] CSRS was performed in accordance of the [facility] prior to dispensing any controlled drugs.    Patient's diagnosis is consistent with skin infection. No palpable abscess. Patient will be discharged home with prescriptions for clindamycin. Patient is to follow up with PCP as directed. Patient is given ED precautions to return to the ED for any worsening or new symptoms.     ____________________________________________  FINAL CLINICAL IMPRESSION(S) / ED DIAGNOSES  Final diagnoses:  Skin infection      NEW MEDICATIONS STARTED DURING THIS VISIT:  ED Discharge Orders         Ordered    clindamycin (CLEOCIN) 300 MG capsule  3 times daily     04/30/18 1117              This chart was dictated using voice recognition software/Dragon. Despite best efforts to proofread, errors can occur which can change the meaning. Any change was purely unintentional.    Enid DerryWagner, Racine Erby, PA-C 04/30/18 1620    Arnaldo NatalMalinda, Paul F, MD 04/30/18 [phone]

## 2018-08-22 ENCOUNTER — Other Ambulatory Visit: Payer: Self-pay

## 2018-08-22 ENCOUNTER — Emergency Department
Admission: EM | Admit: 2018-08-22 | Discharge: 2018-08-22 | Disposition: A | Discharge status: Home / Self Care | Payer: BLUE CROSS/BLUE SHIELD | Attending: Emergency Medicine | Admitting: Emergency Medicine

## 2018-08-22 ENCOUNTER — Emergency Department: Payer: BLUE CROSS/BLUE SHIELD

## 2018-08-22 DIAGNOSIS — S161XXA Strain of muscle, fascia and tendon at neck level, initial encounter: Secondary | ICD-10-CM | POA: Diagnosis not present

## 2018-08-22 DIAGNOSIS — S199XXA Unspecified injury of neck, initial encounter: Secondary | ICD-10-CM | POA: Diagnosis present

## 2018-08-22 DIAGNOSIS — S39012A Strain of muscle, fascia and tendon of lower back, initial encounter: Secondary | ICD-10-CM | POA: Insufficient documentation

## 2018-08-22 DIAGNOSIS — Y939 Activity, unspecified: Secondary | ICD-10-CM | POA: Insufficient documentation

## 2018-08-22 DIAGNOSIS — Y999 Unspecified external cause status: Secondary | ICD-10-CM | POA: Insufficient documentation

## 2018-08-22 DIAGNOSIS — Y92410 Unspecified street and highway as the place of occurrence of the external cause: Secondary | ICD-10-CM | POA: Insufficient documentation

## 2018-08-22 LAB — URINALYSIS, COMPLETE (UACMP) WITH MICROSCOPIC
BACTERIA UA: NONE SEEN
Bilirubin Urine: NEGATIVE
Glucose, UA: NEGATIVE mg/dL
KETONES UR: NEGATIVE mg/dL
Leukocytes, UA: NEGATIVE
Nitrite: NEGATIVE
PH: 7 (ref 5.0–8.0)
Protein, ur: NEGATIVE mg/dL
Specific Gravity, Urine: 1.013 (ref 1.005–1.030)
WBC, UA: NONE SEEN WBC/hpf (ref 0–5)

## 2018-08-22 LAB — POCT PREGNANCY, URINE: PREG TEST UR: NEGATIVE

## 2018-08-22 MED ORDER — IBUPROFEN 600 MG PO TABS: 600.0000 mg | ORAL_TABLET | Freq: Three times a day (TID) | ORAL | 0 refills | Status: AC | PRN

## 2018-08-22 NOTE — Discharge Instructions (Addendum)
Follow-up with your primary care provider if any continued problems or concerns.  Begin taking ibuprofen 600 mg every 8 hours with food as needed for inflammation and soreness.  You may use ice or heat to your muscles as needed for discomfort.  You most likely will be sore for the next 4 to 5 days even with medication.

## 2018-08-22 NOTE — ED Notes (Signed)
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
## 2019-08-01 ENCOUNTER — Other Ambulatory Visit: Payer: Self-pay

## 2019-08-01 DIAGNOSIS — Z20822 Contact with and (suspected) exposure to covid-19: Secondary | ICD-10-CM

## 2019-08-03 ENCOUNTER — Telehealth: Payer: Self-pay | Admitting: General Practice

## 2019-08-03 LAB — NOVEL CORONAVIRUS, NAA: SARS-CoV-2, NAA: NOT DETECTED

## 2019-08-03 NOTE — Telephone Encounter (Signed)
Negative COVID results given. Patient results "NOT Detected." Caller expressed understanding. ° °

## 2020-09-24 ENCOUNTER — Ambulatory Visit: Payer: BC Managed Care – PPO | Admitting: Obstetrics and Gynecology

## 2020-09-25 ENCOUNTER — Telehealth: Payer: Self-pay

## 2020-09-25 NOTE — Telephone Encounter (Signed)
Call to patient about reactive STS (serologic syphilis) results from a [facility]. Patient needs appointment for a confirmatory RPR. No answer, LMOM to return call.   Harvie Heck, RN

## 2020-10-02 ENCOUNTER — Encounter: Payer: Self-pay | Admitting: Obstetrics and Gynecology

## 2020-10-02 ENCOUNTER — Other Ambulatory Visit (HOSPITAL_COMMUNITY)
Admission: RE | Admit: 2020-10-02 | Discharge: 2020-10-02 | Disposition: A | Discharge status: Home / Self Care | Payer: 59 | Source: Ambulatory Visit | Attending: Obstetrics and Gynecology | Admitting: Obstetrics and Gynecology

## 2020-10-02 ENCOUNTER — Ambulatory Visit (INDEPENDENT_AMBULATORY_CARE_PROVIDER_SITE_OTHER): Payer: BC Managed Care – PPO | Admitting: Obstetrics and Gynecology

## 2020-10-02 ENCOUNTER — Other Ambulatory Visit: Payer: Self-pay

## 2020-10-02 VITALS — BP 110/72 | HR 84 | Ht 62.0 in | Wt 186.0 lb

## 2020-10-02 DIAGNOSIS — Z113 Encounter for screening for infections with a predominantly sexual mode of transmission: Secondary | ICD-10-CM | POA: Diagnosis not present

## 2020-10-02 DIAGNOSIS — Z124 Encounter for screening for malignant neoplasm of cervix: Secondary | ICD-10-CM | POA: Insufficient documentation

## 2020-10-02 DIAGNOSIS — Z1239 Encounter for other screening for malignant neoplasm of breast: Secondary | ICD-10-CM

## 2020-10-02 DIAGNOSIS — Z01419 Encounter for gynecological examination (general) (routine) without abnormal findings: Secondary | ICD-10-CM

## 2020-10-02 DIAGNOSIS — Z0000 Encounter for general adult medical examination without abnormal findings: Secondary | ICD-10-CM | POA: Diagnosis not present

## 2020-10-02 DIAGNOSIS — Z7185 Encounter for immunization safety counseling: Secondary | ICD-10-CM | POA: Diagnosis not present

## 2020-10-02 DIAGNOSIS — R5383 Other fatigue: Secondary | ICD-10-CM

## 2020-10-02 DIAGNOSIS — F320 Major depressive disorder, single episode, mild: Secondary | ICD-10-CM

## 2020-10-02 MED ORDER — ESCITALOPRAM OXALATE 20 MG PO TABS: 20.0000 mg | ORAL_TABLET | Freq: Every day | ORAL | 3 refills | Status: AC

## 2020-10-02 NOTE — Progress Notes (Signed)
Gynecology Annual Exam   PCP: [facility]  Chief Complaint:  Chief Complaint  Patient presents with  . Gynecologic Exam    More spotting than usually, heavy cycles, cramps, headaches    History of Present Illness: Patient is a 35 y.o. [id number] presents for annual exam. The patient reports concern today with change in menstrual cycle over the last 6-8 months. Patient reports increase in cramping/pain with periods, as well as an increase in length of menstruation (increasing from 3-4 days, to now at 6 days). Patient was sexually active with a single partner for the last 18 years until May of 2021. Patient denies sexual activity since this time. Patient reports significant life stressors as she is no longer with her partner due to his issues with infidelity and drug use. Patient now acting as a single parent. Patient reports significant struggle with anxiety and depression since 01/2020. Patient reports feeling extreme fatigue that limits her activity on a daily basis and reports a strong desire to isolate herself. Patient denies SI or thoughts of harming herself. Patient states she has previous hx of depression and treatment with lexapro to good effect. Patient does report concern that she may have had an STI exposure from partner prior to separation. Requesting testing today.  LMP: Patient's last menstrual period was 09/25/2020. Average Interval: regular, 21-28 days Duration of flow: 6 days Heavy Menses: yes Clots: no Intermenstrual Bleeding: yes Postcoital Bleeding: no Dysmenorrhea: yes  The patient is not currently sexually active. She currently uses tubal ligation for contraception. She previously experienced dyspareunia.  There is no notable family history of breast or ovarian cancer in her family.  The patient wears seatbelts: yes.   The patient has regular exercise: no.    The patient reports current symptoms of depression.  PHQ-9: 18 (no to question  9)  Review of Systems: Review of Systems  Constitutional: Positive for malaise/fatigue. Negative for chills and fever.  HENT: Negative.   Eyes: Negative.   Respiratory: Negative.   Cardiovascular: Negative.   Gastrointestinal: Negative.   Genitourinary: Negative for dysuria and frequency.       Changes in menstrual cycle, heavier, longer periods  Musculoskeletal: Negative.   Skin: Negative.   Neurological: Negative.   Endo/Heme/Allergies: Negative.   Psychiatric/Behavioral: Positive for depression. Negative for substance abuse and suicidal ideas. The patient is nervous/anxious and has insomnia.     Past Medical History:  Patient Active Problem List   Diagnosis Date Noted  . Current mild episode of major depressive disorder (HCC) 10/02/2020  . Fatigue 10/02/2020    Past Surgical History:  Past Surgical History:  Procedure Laterality Date  . FOOT SURGERY Right   . TUBAL LIGATION      Gynecologic History:  Patient's last menstrual period was 09/25/2020. Contraception: tubal ligation Last Pap: Unsure of date of last pap - patient reports hx of abn pap  Obstetric History: [id number]  Family History:  Family History  Problem Relation Age of Onset  . Colon cancer Paternal Uncle 83    Social History:  Social History   Socioeconomic History  . Marital status: Single    Spouse name: Not on file  . Number of children: Not on file  . Years of education: Not on file  . Highest education level: Not on file  Occupational History  . Not on file  Tobacco Use  . Smoking status: Never Smoker  . Smokeless tobacco: Never Used  Substance and Sexual Activity  .  Alcohol use: No  . Drug use: No  . Sexual activity: Yes    Birth control/protection: Surgical  Other Topics Concern  . Not on file  Social History Narrative   ** Merged History Encounter **       Social Determinants of Health   Financial Resource Strain: Not on file  Food Insecurity: Not on file  Transportation  Needs: Not on file  Physical Activity: Not on file  Stress: Not on file  Social Connections: Not on file  Intimate Partner Violence: Not on file    Allergies:  No Known Allergies  Medications: Prior to Admission medications   Medication Sig Start Date End Date Taking? Authorizing Provider  ibuprofen (ADVIL,MOTRIN) 600 MG tablet Take 1 tablet (600 mg total) by mouth every 8 (eight) hours as needed. 08/22/18  Yes Tommi Rumps, PA-C    Physical Exam Vitals: Blood pressure 110/72, pulse 84, height 5\' 2"  (1.575 m), weight 186 lb (84.4 kg), last menstrual period 09/25/2020.  General: NAD HEENT: normocephalic, anicteric Thyroid: no enlargement, no palpable nodules Pulmonary: No increased work of breathing, CTAB Cardiovascular: RRR, distal pulses 2+ Breast: Breast symmetrical, no tenderness, no palpable nodules or masses, no skin or nipple retraction present, no nipple discharge.  No axillary or supraclavicular lymphadenopathy. Abdomen: NABS, soft, non-tender, non-distended.  Umbilicus without lesions.  No hepatomegaly, splenomegaly or masses palpable. No evidence of hernia  Genitourinary:  External: Normal external female genitalia.  Normal urethral meatus, normal Bartholin's and Skene's glands.    Vagina: Normal vaginal mucosa, no evidence of prolapse.    Cervix: Grossly normal in appearance, no bleeding  Uterus: Non-enlarged, mobile, normal contour.  CMT noted.  Adnexa: ovaries non-enlarged, no adnexal masses  Rectal: deferred  Lymphatic: no evidence of inguinal lymphadenopathy Extremities: no edema, erythema, or tenderness Neurologic: Grossly intact Psychiatric: mood appropriate, affect full   Assessment: 35 y.o. 33 presents for routine annual exam with concern for STI exposure, changes in menstrual pattern, and recent recurrence of depressive sx related to significant life stressors.  Plan: Problem List Items Addressed This Visit      Other   Current mild episode of  major depressive disorder (HCC)   Relevant Medications   escitalopram (LEXAPRO) 20 MG tablet   Other Relevant Orders   Thyroid Panel With TSH   Fatigue   Relevant Orders   CBC   Thyroid Panel With TSH    Other Visit Diagnoses    Screen for sexually transmitted diseases    -  Primary   Relevant Orders   Cytology - PAP   HIV Antibody (routine testing w rflx)   Hepatitis C antibody   RPR   Hepatitis B surface antigen   Immunization counseling       Encounter for gynecological examination without abnormal finding       Relevant Orders   CBC   Routine health maintenance       Screening breast examination       Screening for cervical cancer       Relevant Orders   Cytology - PAP      1) STI screening  wasoffered and accepted  2)  ASCCP guidelines and rational discussed.  Patient opts for every 3 years screening interval  3) Contraception - the patient is currently using  tubal ligation.   4) Routine healthcare maintenance including cholesterol, diabetes screening discussed discussed establishing care with PCP for managment, patient agreeable to plan.  5) PHQ-9 = 18. Restart lexapro, rx'd. Reviewed  plan to take 10mg  (1/2 tablet) for first week and titrate to dose of 20 mg during second week. Will f/u in 3-4 weeks.  6) Changes in menstrual pattern - plan to f/u with lab results, may consider TVUS pending lab results  7) Pronounced fatigue - assess thyroid function and CBC   , CNM, MSN [facility], [facility] 10/02/2020, 9:46 PM

## 2020-10-04 LAB — CYTOLOGY - PAP
Chlamydia: NEGATIVE
Comment: NEGATIVE
Comment: NEGATIVE
Comment: NEGATIVE
Comment: NORMAL
Diagnosis: NEGATIVE
High risk HPV: POSITIVE — AB
Neisseria Gonorrhea: NEGATIVE
Trichomonas: NEGATIVE

## 2020-10-05 LAB — THYROID PANEL WITH TSH
Free Thyroxine Index: 2 (ref 1.2–4.9)
T3 Uptake Ratio: 21 % — ABNORMAL LOW (ref 24–39)
T4, Total: 9.4 ug/dL (ref 4.5–12.0)
TSH: 1.8 u[IU]/mL (ref 0.450–4.500)

## 2020-10-05 LAB — CBC
Hematocrit: 38.6 % (ref 34.0–46.6)
Hemoglobin: 12.8 g/dL (ref 11.1–15.9)
MCH: 27.7 pg (ref 26.6–33.0)
MCHC: 33.2 g/dL (ref 31.5–35.7)
MCV: 84 fL (ref 79–97)
Platelets: 331 10*3/uL (ref 150–450)
RBC: 4.62 x10E6/uL (ref 3.77–5.28)
RDW: 13.1 % (ref 11.7–15.4)
WBC: 5.8 10*3/uL (ref 3.4–10.8)

## 2020-10-05 LAB — RPR: RPR Ser Ql: REACTIVE — AB

## 2020-10-05 LAB — RPR, QUANT+TP ABS (REFLEX)
Rapid Plasma Reagin, Quant: 1:64 {titer} — ABNORMAL HIGH
T Pallidum Abs: REACTIVE — AB

## 2020-10-05 LAB — HIV ANTIBODY (ROUTINE TESTING W REFLEX): HIV Screen 4th Generation wRfx: NONREACTIVE

## 2020-10-05 LAB — HEPATITIS B SURFACE ANTIGEN: Hepatitis B Surface Ag: NEGATIVE

## 2020-10-05 LAB — HEPATITIS C ANTIBODY: Hep C Virus Ab: <0.1 s/co ratio (ref 0.0–0.9)

## 2020-10-08 ENCOUNTER — Encounter: Payer: Self-pay | Admitting: Obstetrics and Gynecology

## 2020-10-08 ENCOUNTER — Telehealth: Payer: Self-pay | Admitting: Obstetrics and Gynecology

## 2020-10-08 NOTE — Telephone Encounter (Signed)
Spoke with patient via phone. Used two patient identifiers to confirm identity. Reviewed lab results from 10/02/20 including RPR, T Pallidum Abs, and RPR quant, as well as positive high risk HPV. Patient stated understanding regarding results. Referred patient to [facility] for treatment. Recommendation for f/u PAP in 1 year for HPV status. Patient denied further questions at this time.

## 2020-10-21 ENCOUNTER — Other Ambulatory Visit: Payer: Self-pay

## 2020-10-21 ENCOUNTER — Encounter: Payer: Self-pay | Admitting: Advanced Practice Midwife

## 2020-10-21 ENCOUNTER — Ambulatory Visit: Payer: Self-pay

## 2020-10-21 ENCOUNTER — Ambulatory Visit: Payer: Self-pay | Admitting: Family Medicine

## 2020-10-21 DIAGNOSIS — A539 Syphilis, unspecified: Secondary | ICD-10-CM

## 2020-10-21 DIAGNOSIS — Z113 Encounter for screening for infections with a predominantly sexual mode of transmission: Secondary | ICD-10-CM

## 2020-10-21 MED ORDER — PENICILLIN G BENZATHINE 1200000 UNIT/2ML IM SUSP
2.4000 10*6.[IU] | Freq: Once | INTRAMUSCULAR | Status: AC
  Administered 2020-10-21: 2.4 10*6.[IU] via INTRAMUSCULAR

## 2020-10-21 NOTE — Progress Notes (Addendum)
Late entry. Client tolerated Bicillin injections with minimal complaint. Writer administered one injection concurrently with Elveria Rising FNP-BC. Per Ms Lanae Boast at appt, one week appt scheduled with provider and reminder card given to client. Jossie Ng, RN

## 2020-10-21 NOTE — Progress Notes (Addendum)
[facility] Department STI clinic/screening visit  Subjective:  Gail Braun is a 35 y.o. female being seen today for an STI screening visit. The patient reports they do not have symptoms.  Patient reports that they do not desire a pregnancy in the next year.   They reported they are not interested in discussing contraception today.  Patient's last menstrual period was 10/19/2020 (exact date).   Patient has the following medical conditions:   Patient Active Problem List   Diagnosis Date Noted  . Current mild episode of major depressive disorder (HCC) 10/02/2020  . Fatigue 10/02/2020    Chief Complaint  Patient presents with  . SEXUALLY TRANSMITTED DISEASE    HPI  Patient reports here referred by [facility] for treatment for syphilis.    Last HIV test per patient/review of record was 10/02/2020 Patient reports last pap was 10/02/2020  See flowsheet for further details and programmatic requirements.    The following portions of the patient's history were reviewed and updated as appropriate: allergies, current medications, past medical history, past social history, past surgical history and problem list.  Objective:  There were no vitals filed for this visit.  Physical Exam Constitutional:      Appearance: Normal appearance. She is obese.  HENT:     Head: Normocephalic.     Mouth/Throat:     Mouth: Mucous membranes are moist.     Pharynx: Oropharynx is clear.  Pulmonary:     Effort: Pulmonary effort is normal.  Abdominal:     Palpations: Abdomen is soft.  Genitourinary:    Comments: Declined pelvic exam d/t on menses.   Musculoskeletal:     Cervical back: Normal range of motion and neck supple.  Lymphadenopathy:     Cervical: No cervical adenopathy.  Skin:    General: Skin is warm and dry.     Findings: Rash present.     Comments: Petechial rash on trunk and back   Neurological:     Mental Status: She is alert and oriented to person, place, and time.   Psychiatric:        Mood and Affect: Mood normal.        Behavior: Behavior normal.      Assessment and Plan:  Gail Braun is a 35 y.o. female presenting to the [facility] Department for STI screening  1. Screening examination for venereal disease Patient here for treatment for Syphilis.  Patient reports that last sex was 06/02/20 with partner of 18+ years  that started using IV drugs and had additional partners.    Patient was seen at [facility] for physical and was tested for HIV, RPR, HBV & HCV .  Repeated RPR today.  RPR on 10/03/19 was 1:64 with + Trep.   Patient declined CT/GC today d/t on menses.  Will return for testing.  Appointment to be scheduled.    2. Syphilis Treat for + syphilis. - Syphilis Serology, [facility] - penicillin g benzathine (BICILLIN LA) 1200000 UNIT/2ML injection 2.4 Million Units  Discussed with patient about the state HD (DIS) contacting her about the + RPR and it is [place] law that she is interviewed by them. If she is concerned about if DIS is legit, she can call [facility] to verify DIS worker.  Patient verbalizes understanding.    Patient accepted  screenings for bloodwork for repeat RPR.  Patient meets criteria for HepB screening? Yes. Ordered? No - testing with in last 30 days  Patient meets  criteria for HepC screening? Yes. Ordered? No - testing with in last 30 days   Discussed time line for State Lab results and that patient will be called with positive results and encouraged patient to call if she had not heard in 2 weeks.   Counseled to return or seek care for continued or worsening symptoms Recommended no sex for 14 days.   Patient is currently using Postpartum Sterilization to prevent pregnancy.    Return in about 10 days (around 10/31/2020) for Appt schedule for 10/28/2020 and reminder card given..  Future Appointments  Date Time Provider Department Center  10/28/2020 10:00 AM [facility] PROVIDER [facility] None  11/01/2020  1:50 PM Zipporah Plants, CNM [facility] None    Wendi Snipes, FNP

## 2020-10-28 ENCOUNTER — Ambulatory Visit: Payer: Self-pay

## 2020-10-28 ENCOUNTER — Encounter: Payer: Self-pay | Admitting: Physician Assistant

## 2020-10-28 DIAGNOSIS — A539 Syphilis, unspecified: Secondary | ICD-10-CM | POA: Insufficient documentation

## 2020-11-01 ENCOUNTER — Ambulatory Visit: Payer: BC Managed Care – PPO | Admitting: Obstetrics and Gynecology

## 2022-05-20 ENCOUNTER — Ambulatory Visit: Payer: Self-pay

## 2024-06-06 ENCOUNTER — Other Ambulatory Visit: Payer: Self-pay | Admitting: Medical Genetics
